# Patient Record
Sex: Female | Born: 2005 | Race: Black or African American | Hispanic: No | Marital: Single | Smoking: Never smoker
Health system: Southern US, Community
[De-identification: ages and names within clinical notes are randomized; demographics above are authoritative.]

---
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
## 2023-06-13 DIAGNOSIS — Z01419 Encounter for gynecological examination (general) (routine) without abnormal findings: Secondary | ICD-10-CM | POA: Diagnosis not present

## 2023-06-13 DIAGNOSIS — Z113 Encounter for screening for infections with a predominantly sexual mode of transmission: Secondary | ICD-10-CM | POA: Diagnosis not present

## 2023-06-13 DIAGNOSIS — Z1389 Encounter for screening for other disorder: Secondary | ICD-10-CM | POA: Diagnosis not present

## 2023-06-13 DIAGNOSIS — Z3040 Encounter for surveillance of contraceptives, unspecified: Secondary | ICD-10-CM | POA: Diagnosis not present

## 2023-06-13 DIAGNOSIS — R350 Frequency of micturition: Secondary | ICD-10-CM | POA: Diagnosis not present

## 2023-06-13 DIAGNOSIS — Z0000 Encounter for general adult medical examination without abnormal findings: Secondary | ICD-10-CM | POA: Diagnosis not present

## 2023-06-13 DIAGNOSIS — Z3202 Encounter for pregnancy test, result negative: Secondary | ICD-10-CM | POA: Diagnosis not present

## 2023-06-15 DIAGNOSIS — Z113 Encounter for screening for infections with a predominantly sexual mode of transmission: Secondary | ICD-10-CM | POA: Diagnosis not present

## 2023-11-28 DIAGNOSIS — Z3009 Encounter for other general counseling and advice on contraception: Secondary | ICD-10-CM | POA: Diagnosis not present

## 2023-11-28 DIAGNOSIS — Z3040 Encounter for surveillance of contraceptives, unspecified: Secondary | ICD-10-CM | POA: Diagnosis not present
